# Patient Record
Sex: Female | Born: 2000 | Race: Black or African American | Hispanic: No | Marital: Single | Smoking: Never smoker
Health system: Southern US, Community
[De-identification: ages and names within clinical notes are randomized; demographics above are authoritative.]

## PROBLEM LIST (undated history)

## (undated) HISTORY — PX: DENTAL SURGERY: SHX609

---
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
## 2020-05-24 ENCOUNTER — Encounter (HOSPITAL_COMMUNITY): Payer: Self-pay | Admitting: Emergency Medicine

## 2020-05-24 ENCOUNTER — Other Ambulatory Visit: Payer: Self-pay

## 2020-05-24 ENCOUNTER — Emergency Department (HOSPITAL_COMMUNITY)
Admission: EM | Admit: 2020-05-24 | Discharge: 2020-05-24 | Disposition: A | Discharge status: Home / Self Care | Payer: No Typology Code available for payment source | Attending: Emergency Medicine | Admitting: Emergency Medicine

## 2020-05-24 DIAGNOSIS — M542 Cervicalgia: Secondary | ICD-10-CM | POA: Insufficient documentation

## 2020-05-24 DIAGNOSIS — S0990XA Unspecified injury of head, initial encounter: Secondary | ICD-10-CM | POA: Insufficient documentation
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
## 2020-05-24 NOTE — Discharge Instructions (Signed)
Follow instructions regarding head injury precautions. Follow-up with your primary care provider. Return to the ER if you start to experience worsening headache, additional injuries, blurry vision, numbness in arms or legs or trouble walking.
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
## 2020-07-15 ENCOUNTER — Emergency Department (HOSPITAL_COMMUNITY)
Admission: EM | Admit: 2020-07-15 | Discharge: 2020-07-15 | Disposition: A | Discharge status: Home / Self Care | Payer: Medicaid Other | Attending: Emergency Medicine | Admitting: Emergency Medicine

## 2020-07-15 ENCOUNTER — Emergency Department (HOSPITAL_COMMUNITY): Payer: Medicaid Other

## 2020-07-15 ENCOUNTER — Encounter (HOSPITAL_COMMUNITY): Payer: Self-pay

## 2020-07-15 ENCOUNTER — Other Ambulatory Visit: Payer: Self-pay

## 2020-07-15 DIAGNOSIS — R110 Nausea: Secondary | ICD-10-CM | POA: Insufficient documentation

## 2020-07-15 DIAGNOSIS — U071 COVID-19: Secondary | ICD-10-CM | POA: Diagnosis not present

## 2020-07-15 DIAGNOSIS — R091 Pleurisy: Secondary | ICD-10-CM | POA: Insufficient documentation

## 2020-07-15 DIAGNOSIS — R059 Cough, unspecified: Secondary | ICD-10-CM | POA: Diagnosis present

## 2020-07-15 LAB — URINALYSIS, ROUTINE W REFLEX MICROSCOPIC
Bilirubin Urine: NEGATIVE
Glucose, UA: NEGATIVE mg/dL
Hgb urine dipstick: NEGATIVE
Ketones, ur: 20 mg/dL — AB
Leukocytes,Ua: NEGATIVE
Nitrite: NEGATIVE
Protein, ur: NEGATIVE mg/dL
Specific Gravity, Urine: 1.009 (ref 1.005–1.030)
pH: 6 (ref 5.0–8.0)

## 2020-07-15 LAB — COMPREHENSIVE METABOLIC PANEL
ALT: 12 U/L (ref 0–44)
AST: 19 U/L (ref 15–41)
Albumin: 4 g/dL (ref 3.5–5.0)
Alkaline Phosphatase: 52 U/L (ref 38–126)
Anion gap: 10 (ref 5–15)
BUN: 9 mg/dL (ref 6–20)
CO2: 23 mmol/L (ref 22–32)
Calcium: 9.1 mg/dL (ref 8.9–10.3)
Chloride: 106 mmol/L (ref 98–111)
Creatinine, Ser: 0.93 mg/dL (ref 0.44–1.00)
GFR, Estimated: >60 mL/min (ref >60)
Glucose, Bld: 121 mg/dL — ABNORMAL HIGH (ref 70–99)
Potassium: 3.4 mmol/L — ABNORMAL LOW (ref 3.5–5.1)
Sodium: 139 mmol/L (ref 135–145)
Total Bilirubin: 0.3 mg/dL (ref 0.3–1.2)
Total Protein: 7.5 g/dL (ref 6.5–8.1)

## 2020-07-15 LAB — CBC WITH DIFFERENTIAL/PLATELET
Abs Immature Granulocytes: 0.02 10*3/uL (ref 0.00–0.07)
Basophils Absolute: 0 10*3/uL (ref 0.0–0.1)
Basophils Relative: 1 %
Eosinophils Absolute: 0 10*3/uL (ref 0.0–0.5)
Eosinophils Relative: 0 %
HCT: 35.9 % — ABNORMAL LOW (ref 36.0–46.0)
Hemoglobin: 11.8 g/dL — ABNORMAL LOW (ref 12.0–15.0)
Immature Granulocytes: 1 %
Lymphocytes Relative: 25 %
Lymphs Abs: 0.9 10*3/uL (ref 0.7–4.0)
MCH: 22.8 pg — ABNORMAL LOW (ref 26.0–34.0)
MCHC: 32.9 g/dL (ref 30.0–36.0)
MCV: 69.4 fL — ABNORMAL LOW (ref 80.0–100.0)
Monocytes Absolute: 0.9 10*3/uL (ref 0.1–1.0)
Monocytes Relative: 23 %
Neutro Abs: 2 10*3/uL (ref 1.7–7.7)
Neutrophils Relative %: 50 %
Platelets: 201 10*3/uL (ref 150–400)
RBC: 5.17 MIL/uL — ABNORMAL HIGH (ref 3.87–5.11)
RDW: 18.1 % — ABNORMAL HIGH (ref 11.5–15.5)
WBC: 3.8 10*3/uL — ABNORMAL LOW (ref 4.0–10.5)
nRBC: 0 % (ref 0.0–0.2)

## 2020-07-15 LAB — MONONUCLEOSIS SCREEN: Mono Screen: NEGATIVE

## 2020-07-15 LAB — LIPASE, BLOOD: Lipase: 30 U/L (ref 11–51)

## 2020-07-15 LAB — GROUP A STREP BY PCR: Group A Strep by PCR: NOT DETECTED

## 2020-07-15 LAB — I-STAT BETA HCG BLOOD, ED (MC, WL, AP ONLY): I-stat hCG, quantitative: <5 m[IU]/mL (ref <5)

## 2020-07-15 LAB — D-DIMER, QUANTITATIVE: D-Dimer, Quant: 0.68 ug/mL-FEU — ABNORMAL HIGH (ref 0.00–0.50)

## 2020-07-15 MED ORDER — ACETAMINOPHEN 500 MG PO TABS: 500.0000 mg | ORAL_TABLET | Freq: Four times a day (QID) | ORAL | 0 refills | Status: AC | PRN

## 2020-07-15 MED ORDER — IOHEXOL 350 MG/ML SOLN
100.0000 mL | Freq: Once | INTRAVENOUS | Status: AC | PRN
  Administered 2020-07-15: 58 mL via INTRAVENOUS

## 2020-07-15 MED ORDER — IBUPROFEN 600 MG PO TABS: 600.0000 mg | ORAL_TABLET | Freq: Four times a day (QID) | ORAL | 0 refills | Status: AC | PRN

## 2020-07-15 MED ORDER — LACTATED RINGERS IV BOLUS
1000.0000 mL | Freq: Once | INTRAVENOUS | Status: AC
  Administered 2020-07-15: 1000 mL via INTRAVENOUS

## 2020-07-15 MED ORDER — ALBUTEROL SULFATE HFA 108 (90 BASE) MCG/ACT IN AERS: 2.0000 | INHALATION_SPRAY | RESPIRATORY_TRACT | 0 refills | Status: DC | PRN

## 2020-07-15 MED ORDER — KETOROLAC TROMETHAMINE 30 MG/ML IJ SOLN
30.0000 mg | Freq: Once | INTRAMUSCULAR | Status: AC
  Administered 2020-07-15: 30 mg via INTRAVENOUS
  Filled 2020-07-15: qty 1

## 2020-07-15 NOTE — ED Triage Notes (Addendum)
Coming from home, covid symptoms for 2 weeks, denies asthma, productive cough with yellow sputum, subjective fever

## 2020-07-15 NOTE — ED Notes (Signed)
Patient ambulated from triage to room 22, stable gait, no obvious discomfort

## 2020-07-15 NOTE — ED Notes (Addendum)
Patient transported to CT 

## 2020-07-15 NOTE — Discharge Instructions (Addendum)
1.  Your CT scan does not show any evidence of blood clot or significant pneumonia.  After having had a viral illness, people can get a condition called pleurisy.  This is described in your discharge instructions. 2.  Take ibuprofen 600 mg every 8 hours with food for pain control.  May also take acetaminophen as well if you need additional pain control.  You have been given a prescription for an inhaler to use if you feel short of breath or like you are wheezing. 3.  You need a recheck with your family doctor within 3 to 7 days.  If your symptoms are worsening, you are developing new concerning symptoms you must return to the emergency department for recheck

## 2020-07-15 NOTE — ED Notes (Signed)
Patient back from CT at this time

## 2020-07-15 NOTE — ED Notes (Signed)
Patient provided with warm blankets per request 

## 2020-07-15 NOTE — ED Provider Notes (Signed)
At this [facility]  [facility]OS[facility]-EMERGENCY DEPT Provider Note   CSN: [id number] Arrival date & time: 07/15/20  1062     History Chief Complaint  Patient presents with  . covid symptoms    Richardine Peppers is a 20 y.o. female.  HPI Patient reports that she has no previous medical problems.  She tested positive for COVID [id number].  She reports 2 weeks ago at onset she had body aches and chills and felt very bad.  She had some cough and nausea and fatigue.  She reports after the initial week she was feeling better and symptoms seem to be resolving.  She reports now about 2 days ago she started to feel really bad like she did at the beginning of the illness.  She reports she is felt achy and fatigued.  She reports now she also is having chest pain which she had not previously had.  Chest pain is central and in her left upper back.  She reports it is worse with a deep breath cough or movement.  She reports she has had nausea but no vomiting.  She has been taking fluids.  Reports she also has a sore throat.  She has some lower abdominal pain.  No diarrhea.  No pain or burning with urination.  No swelling or pain in the legs.  No personal history of DVT.  Patient is not on birth control.  She reports she smokes vapes and marijuana but not cigarettes.    History reviewed. No pertinent past medical history.  There are no problems to display for this patient.   Past Surgical History:  Procedure Laterality Date  . DENTAL SURGERY       OB History   No obstetric history on file.     Family History  Problem Relation Age of Onset  . Healthy Mother   . Healthy Father     Social History   Tobacco Use  . Smoking status: Never Smoker  . Smokeless tobacco: Never Used  Substance Use Topics  . Alcohol use: Not Currently  . Drug use: Not Currently    Home Medications Prior to Admission medications   Medication Sig Start Date End Date Taking? Authorizing Provider  acetaminophen  (TYLENOL) 500 MG tablet Take 1 tablet (500 mg total) by mouth every 6 (six) hours as needed. 07/15/20  Yes Arby Barrette, MD  albuterol (VENTOLIN HFA) 108 (90 Base) MCG/ACT inhaler Inhale 2 puffs into the lungs every 4 (four) hours as needed for wheezing or shortness of breath. 07/15/20  Yes Phallon Haydu, Lebron Conners, MD  ibuprofen (ADVIL) 600 MG tablet Take 1 tablet (600 mg total) by mouth every 6 (six) hours as needed. 07/15/20  Yes Arby Barrette, MD  metroNIDAZOLE (FLAGYL) 500 MG tablet Take 1 tablet (500 mg total) by mouth 2 (two) times daily. 05/16/18   Elvina Sidle, MD    Allergies    Patient has no known allergies.  Review of Systems   Review of Systems 10 systems reviewed negative except HPI Physical Exam Updated Vital Signs BP 127/79   Pulse 76   Temp 98.4 F (36.9 C) (Oral)   Resp 16   Ht 5' (1.524 m)   Wt 54.4 kg   LMP 06/30/2020 Comment: negative HCG blood test 07-15-20  SpO2 100%   BMI 23.44 kg/m   Physical Exam Constitutional:      Appearance: She is well-developed.  HENT:     Head: Normocephalic and atraumatic.     Mouth/Throat:  Comments: Posterior oropharynx is widely patent.  Mild erythema of the tonsillar pillars.  No exudates or significant swelling. Eyes:     Extraocular Movements: Extraocular movements intact.     Conjunctiva/sclera: Conjunctivae normal.  Cardiovascular:     Rate and Rhythm: Normal rate and regular rhythm.     Heart sounds: Normal heart sounds.  Pulmonary:     Effort: Pulmonary effort is normal.     Breath sounds: Normal breath sounds.  Abdominal:     General: Bowel sounds are normal. There is no distension.     Palpations: Abdomen is soft.     Tenderness: There is abdominal tenderness.     Comments: Mild to moderate tenderness diffusely of the lower abdomen.  No guarding.  Upper abdomen nontender no appreciable hepatosplenomegaly.  Musculoskeletal:        General: No swelling or tenderness. Normal range of motion.     Cervical back:  Neck supple.     Right lower leg: No edema.     Left lower leg: No edema.  Skin:    General: Skin is warm and dry.  Neurological:     Mental Status: She is alert and oriented to person, place, and time.     GCS: GCS eye subscore is 4. GCS verbal subscore is 5. GCS motor subscore is 6.     Coordination: Coordination normal.  Psychiatric:        Mood and Affect: Mood normal.     ED Results / Procedures / Treatments   Labs (all labs ordered are listed, but only abnormal results are displayed) Labs Reviewed  COMPREHENSIVE METABOLIC PANEL - Abnormal; Notable for the following components:      Result Value   Potassium 3.4 (*)    Glucose, Bld 121 (*)    All other components within normal limits  CBC WITH DIFFERENTIAL/PLATELET - Abnormal; Notable for the following components:   WBC 3.8 (*)    RBC 5.17 (*)    Hemoglobin 11.8 (*)    HCT 35.9 (*)    MCV 69.4 (*)    MCH 22.8 (*)    RDW 18.1 (*)    All other components within normal limits  D-DIMER, QUANTITATIVE - Abnormal; Notable for the following components:   D-Dimer, Quant 0.68 (*)    All other components within normal limits  URINALYSIS, ROUTINE W REFLEX MICROSCOPIC - Abnormal; Notable for the following components:   APPearance CLOUDY (*)    Ketones, ur 20 (*)    All other components within normal limits  GROUP A STREP BY PCR  LIPASE, BLOOD  MONONUCLEOSIS SCREEN  I-STAT BETA HCG BLOOD, ED ([facility], [facility], [facility] ONLY)    EKG None  Radiology CT Angio Chest PE W/Cm &/Or Wo Cm  Result Date: 07/15/2020 CLINICAL DATA:  Positive D-dimer, cough and shortness of breath with COVID few weeks prior EXAM: CT ANGIOGRAPHY CHEST WITH CONTRAST TECHNIQUE: Multidetector CT imaging of the chest was performed using the standard protocol during bolus administration of intravenous contrast. Multiplanar CT image reconstructions and MIPs were obtained to evaluate the vascular anatomy. CONTRAST:  71mL OMNIPAQUE IOHEXOL 350 MG/ML SOLN COMPARISON:  None.  FINDINGS: Cardiovascular: Satisfactory opacification of the pulmonary arteries to the segmental level. No evidence of pulmonary embolism. Normal heart size. No pericardial effusion. Mediastinum/Nodes: No enlarged lymph nodes. Thyroid and esophagus are unremarkable. Lungs/Pleura: Minimal small foci of ground-glass. No pleural effusion. No pneumothorax. Upper Abdomen: No acute abnormality. Musculoskeletal: No significant or acute osseous abnormality. Review of the  MIP images confirms the above findings. IMPRESSION: No evidence of acute pulmonary embolism. Minimal small foci of ground-glass density may reflect atelectasis or minor residual changes of recent pneumonia. Electronically Signed   By: Guadlupe Spanish M.D.   On: 07/15/2020 12:41   DG Chest Port 1 View  Result Date: 07/15/2020 CLINICAL DATA:  COVID-19 symptoms. Productive cough and subjective fever. EXAM: PORTABLE CHEST 1 VIEW COMPARISON:  02/16/2018 FINDINGS: The cardiomediastinal silhouette is within normal limits. The lungs are well inflated and clear. There is no evidence of pleural effusion or pneumothorax. No acute osseous abnormality is identified. IMPRESSION: No active disease. Electronically Signed   By: Sebastian Ache M.D.   On: 07/15/2020 10:26    Procedures Procedures   Medications Ordered in ED Medications  lactated ringers bolus 1,000 mL (0 mLs Intravenous Stopped 07/15/20 1139)  ketorolac (TORADOL) 30 MG/ML injection 30 mg (30 mg Intravenous Given 07/15/20 1033)  iohexol (OMNIPAQUE) 350 MG/ML injection 100 mL (58 mLs Intravenous Contrast Given 07/15/20 1206)    ED Course  I have reviewed the triage vital signs and the nursing notes.  Pertinent labs & imaging results that were available during my care of the patient were reviewed by me and considered in my medical decision making (see chart for details).    MDM Rules/Calculators/A&P                         Patient presents as outlined.  Time, PE study is negative for PE.  Minor  groundglass findings probably residual from COVID.  No signs of consolidation or focal pneumonia.  Clinically patient is well in appearance.  She has no respiratory distress.  At this time chest pain most likely pleurisy post COVID.  She is stable.  Appropriate at this time to continue symptomatic treatment with ibuprofen and Tylenol as needed.  Albuterol inhaler prescribed if shortness of breath or wheezing.  Return precautions reviewed. Final Clinical Impression(s) / ED Diagnoses Final diagnoses:  Pleurisy  COVID-19    Rx / DC Orders ED Discharge Orders         Ordered    ibuprofen (ADVIL) 600 MG tablet  Every 6 hours PRN        07/15/20 1410    acetaminophen (TYLENOL) 500 MG tablet  Every 6 hours PRN        07/15/20 1410    albuterol (VENTOLIN HFA) 108 (90 Base) MCG/ACT inhaler  Every 4 hours PRN        07/15/20 1410           Arby Barrette, MD 07/15/20 1413

## 2021-06-23 IMAGING — CT CT ANGIO CHEST
3 of 7 series · 18 of 36 positions shown · IV contrast (OMNIPAQUE 350)
Comparison: None.

CLINICAL DATA: Positive D-dimer, cough and shortness of breath with
COVID few weeks prior

EXAM:
CT ANGIOGRAPHY CHEST WITH CONTRAST
TECHNIQUE: Multidetector CT imaging of the chest was performed using the
standard protocol during bolus administration of intravenous
contrast. Multiplanar CT image reconstructions and MIPs were
obtained to evaluate the vascular anatomy.
CONTRAST:  58mL OMNIPAQUE IOHEXOL 350 MG/ML SOLN

[Series 5: thins · axial · 0.58mm/px · z∈[-160,+55]mm · 12 of 255 slices shown]
[im 20/255  lung]
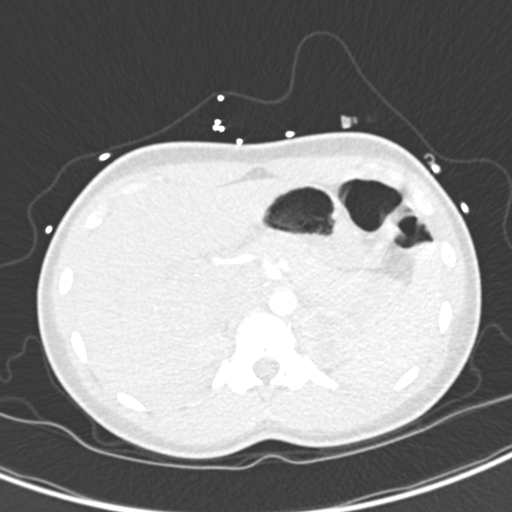
[im 40/255  mediastinal]
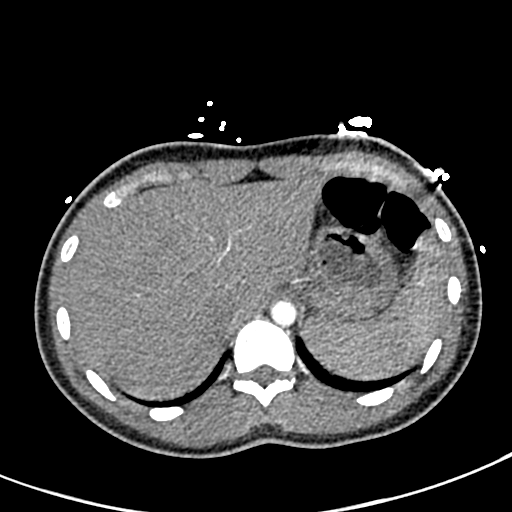
[im 59/255  lung]
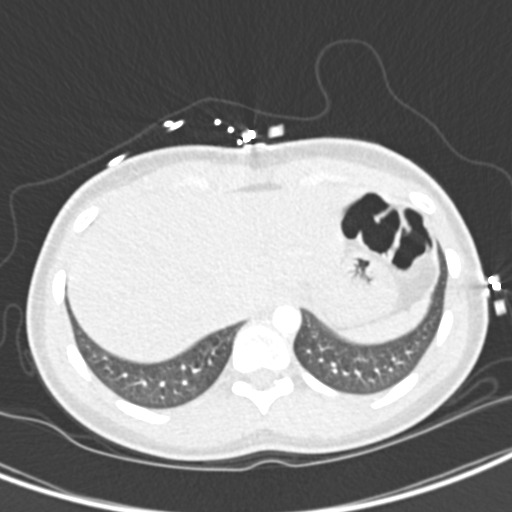
[im 79/255  mediastinal]
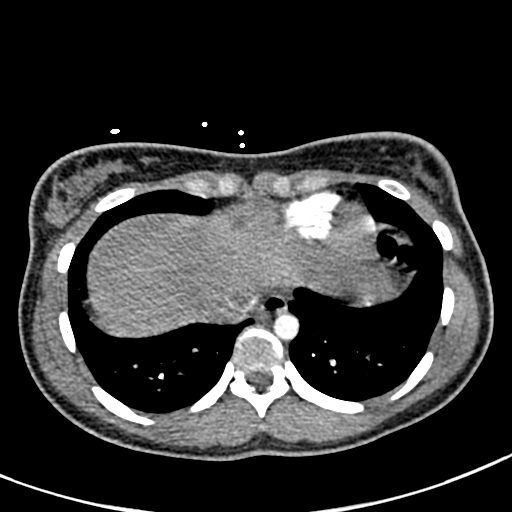
[im 98/255  lung]
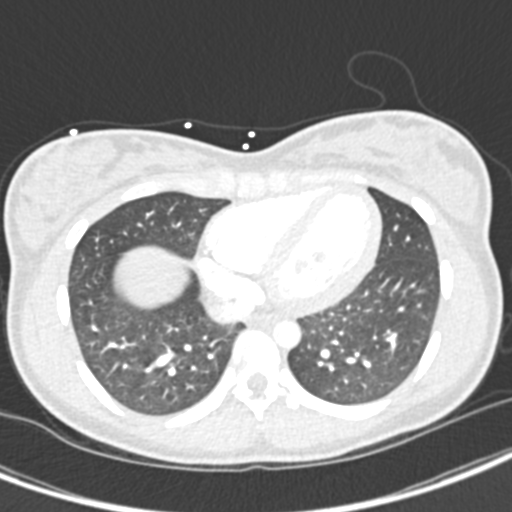
[im 118/255  mediastinal]
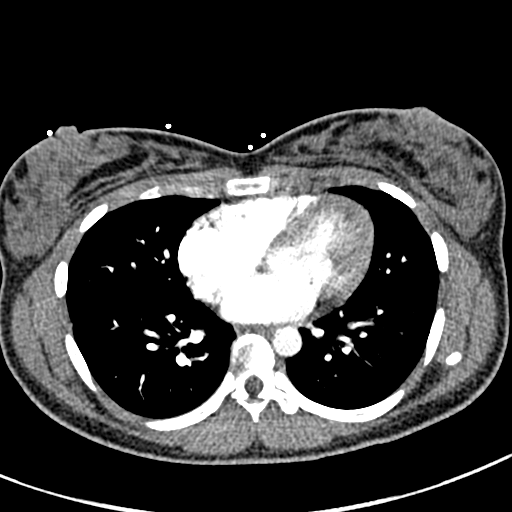
[im 137/255  lung]
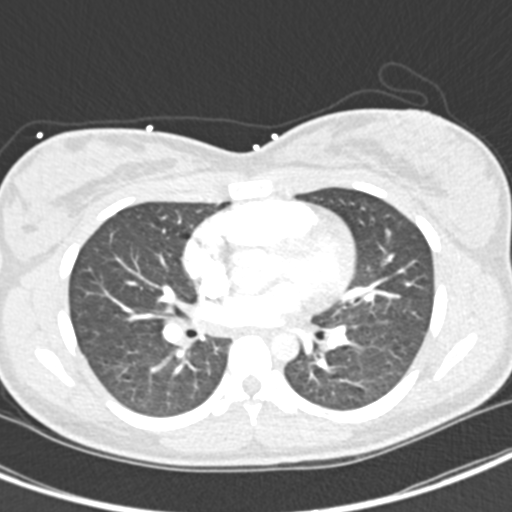
[im 157/255  mediastinal]
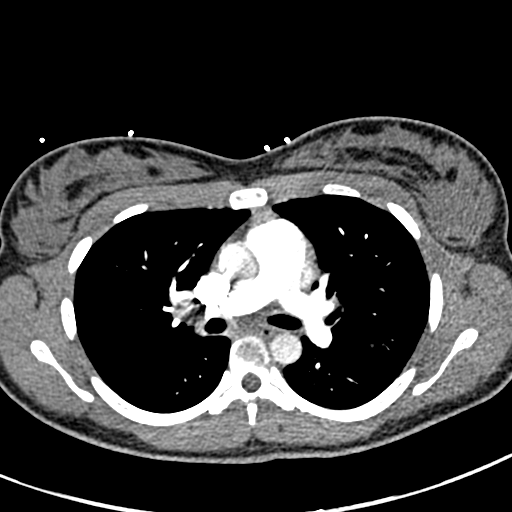
[im 176/255  lung]
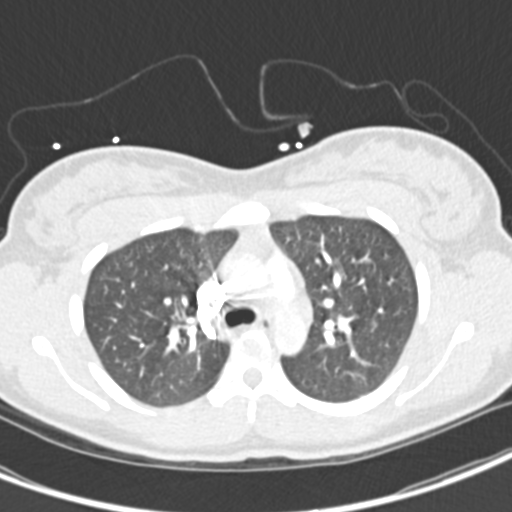
[im 196/255  mediastinal]
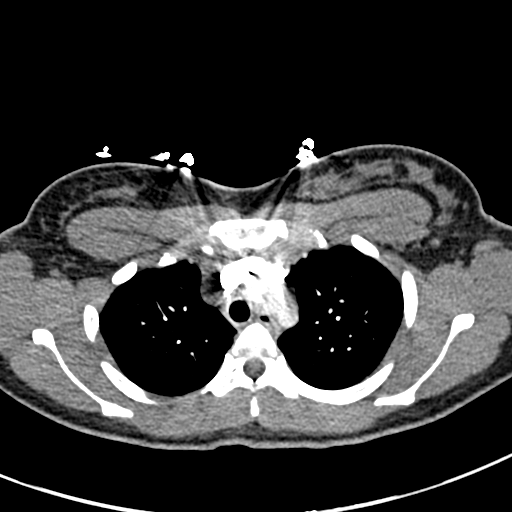
[im 215/255  lung]
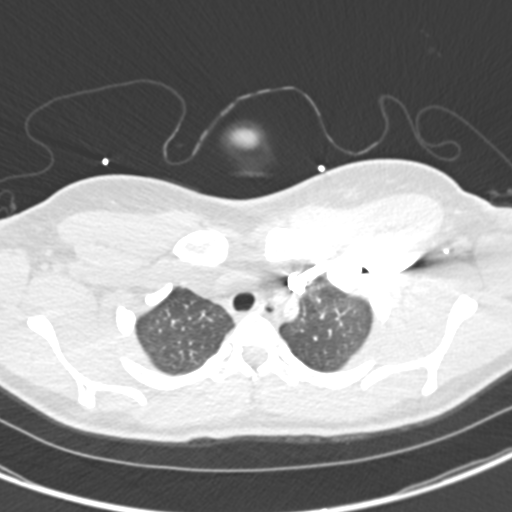
[im 235/255  mediastinal]
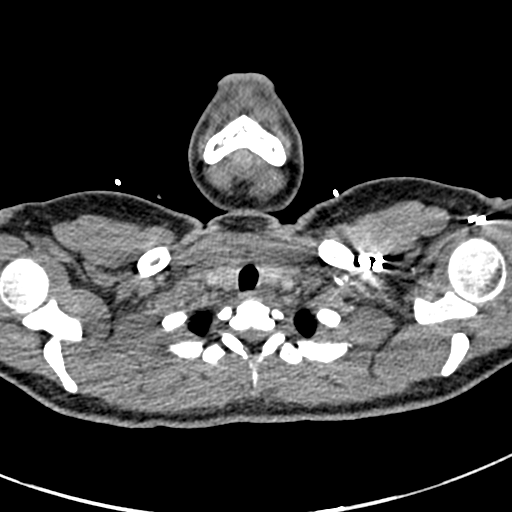

[Series 6: coronal mpr · coronal · 0.58mm/px · 1 of 92 slices shown]
[im 46/92  mediastinal]
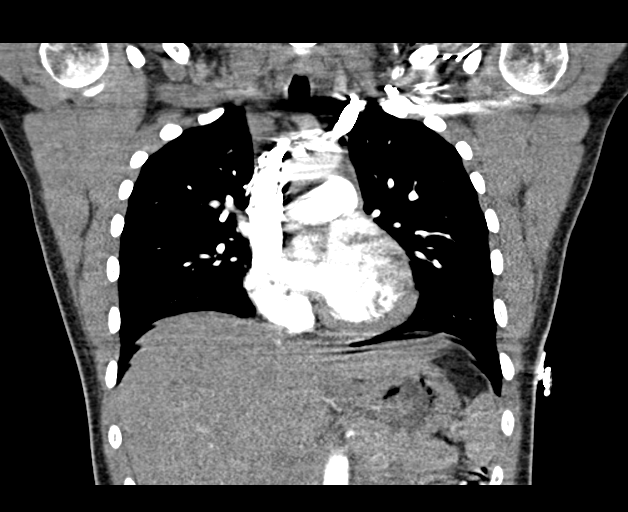

[Series 10: lung · axial · 0.58mm/px · z∈[-128,+32]mm · 5 of 122 slices shown]
[im 21/122  mediastinal]
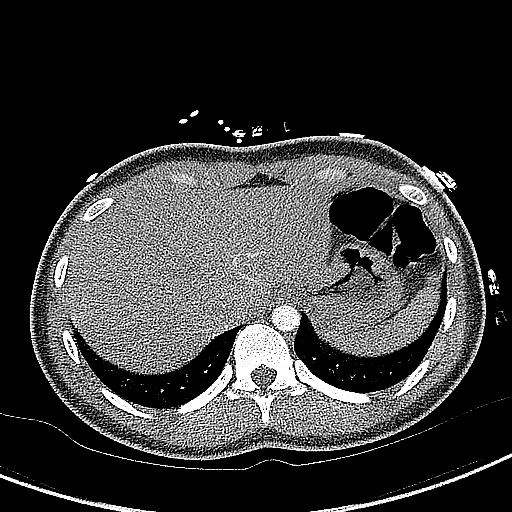
[im 41/122  mediastinal]
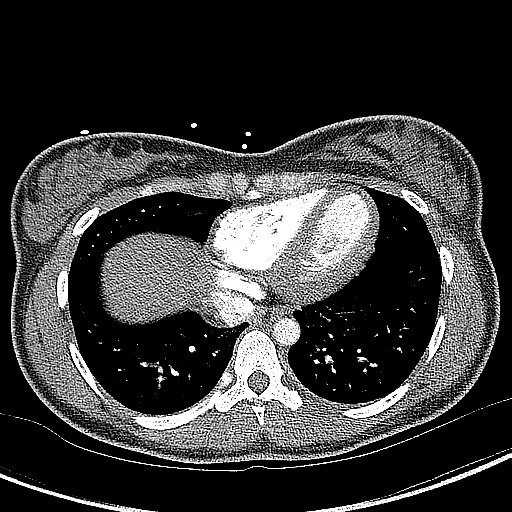
[im 61/122  mediastinal]
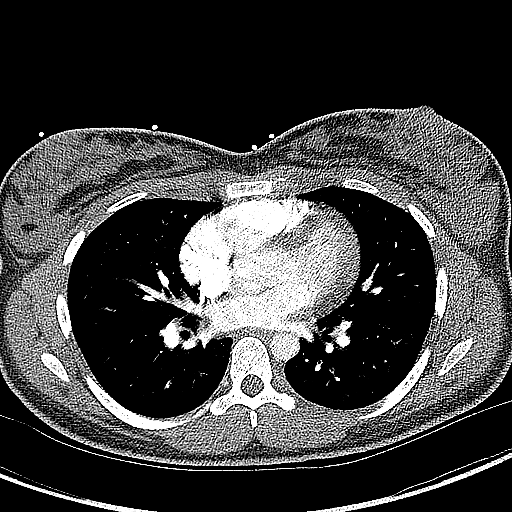
[im 81/122  mediastinal]
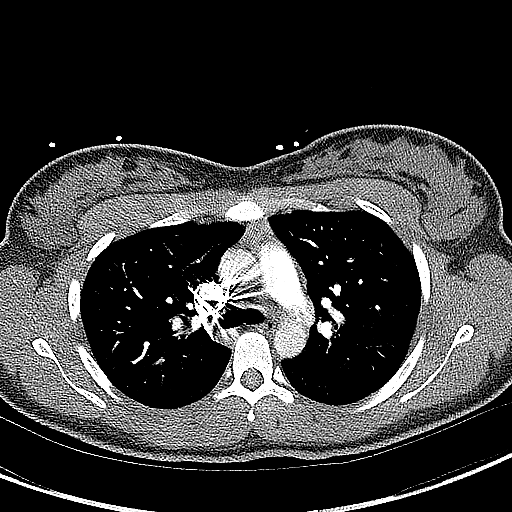
[im 101/122  mediastinal]
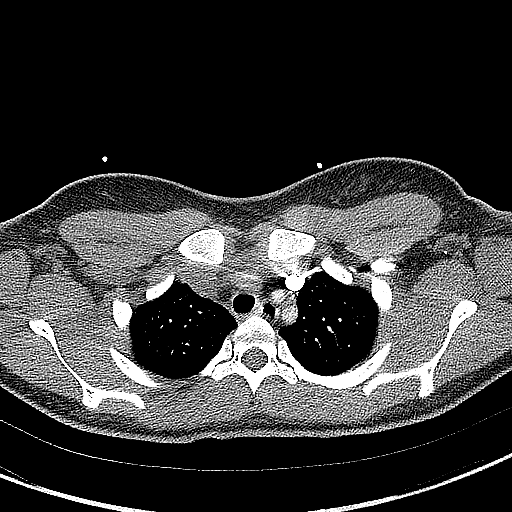

[18 of 36 positions shown; findings below may reference images not displayed]

FINDINGS: Cardiovascular: Satisfactory opacification of the pulmonary arteries
to the segmental level. No evidence of pulmonary embolism. Normal
heart size. No pericardial effusion.

Mediastinum/Nodes: No enlarged lymph nodes. Thyroid and esophagus
are unremarkable.

Lungs/Pleura: Minimal small foci of ground-glass. No pleural
effusion. No pneumothorax.

Upper Abdomen: No acute abnormality.

Musculoskeletal: No significant or acute osseous abnormality.

Review of the MIP images confirms the above findings.
IMPRESSION: No evidence of acute pulmonary embolism.

Minimal small foci of ground-glass density may reflect atelectasis
or minor residual changes of recent pneumonia.

## 2022-06-29 ENCOUNTER — Ambulatory Visit
Admission: EM | Admit: 2022-06-29 | Discharge: 2022-06-29 | Disposition: A | Discharge status: Home / Self Care | Payer: Medicaid Other | Attending: Urgent Care | Admitting: Urgent Care

## 2022-06-29 DIAGNOSIS — F17290 Nicotine dependence, other tobacco product, uncomplicated: Secondary | ICD-10-CM | POA: Insufficient documentation

## 2022-06-29 DIAGNOSIS — Z720 Tobacco use: Secondary | ICD-10-CM

## 2022-06-29 DIAGNOSIS — Z1152 Encounter for screening for COVID-19: Secondary | ICD-10-CM | POA: Diagnosis not present

## 2022-06-29 DIAGNOSIS — Z7251 High risk heterosexual behavior: Secondary | ICD-10-CM | POA: Insufficient documentation

## 2022-06-29 DIAGNOSIS — B349 Viral infection, unspecified: Secondary | ICD-10-CM | POA: Insufficient documentation

## 2022-06-29 MED ORDER — PROMETHAZINE-DM 6.25-15 MG/5ML PO SYRP: 5.0000 mL | ORAL_SOLUTION | Freq: Three times a day (TID) | ORAL | 0 refills | Status: AC | PRN

## 2022-06-29 MED ORDER — CETIRIZINE HCL 10 MG PO TABS: 10.0000 mg | ORAL_TABLET | Freq: Every day | ORAL | 0 refills | Status: DC

## 2022-06-29 MED ORDER — PREDNISONE 20 MG PO TABS: ORAL_TABLET | ORAL | 0 refills | Status: DC

## 2022-06-29 NOTE — Discharge Instructions (Signed)
We will notify you of your test results as they arrive and may take between about 24 hours.  I encourage you to sign up for MyChart if you have not already done so as this can be the easiest way for us to communicate results to you online or through a phone app.  Generally, we only contact you if it is a positive test result.  In the meantime, if you develop worsening symptoms including fever, chest pain, shortness of breath despite our current treatment plan then please report to the emergency room as this may be a sign of worsening status from possible viral infection.  Otherwise, we will manage this as a viral syndrome. For sore throat or cough try using a honey-based tea. Use 3 teaspoons of honey with juice squeezed from half lemon. Place shaved pieces of ginger into 1/2-1 cup of water and warm over stove top. Then mix the ingredients and repeat every 4 hours as needed. Please take Tylenol 500mg-650mg every 6 hours for aches and pains, fevers. Hydrate very well with at least 2 liters of water. Eat light meals such as soups to replenish electrolytes and soft fruits, veggies. Start an antihistamine like Zyrtec (10mg daily) for postnasal drainage, sinus congestion.  You can take this together with prednisone.  Use the cough medications as needed.   

## 2022-06-29 NOTE — ED Provider Notes (Signed)
[facility]  Note:  This document was prepared using Conservation officer, historic buildings and may include unintentional dictation errors.  MRN: [id number] DOB: 09-13-2000  Subjective:   Erin Mccarthy is a 22 y.o. female presenting for 4 day history of sinus/chest congestion, sinus pain, throat pain, painful swallowing, productive cough that can elicit chest pain, mild shob. Denies fever, wheezing, body aches. No history of asthma. Smokes marijuana multiple times weekly, vapes daily.  Has been sick frequently this year already.   No current facility-administered medications for this encounter.  Current Outpatient Medications:    acetaminophen (TYLENOL) 500 MG tablet, Take 1 tablet (500 mg total) by mouth every 6 (six) hours as needed., Disp: 30 tablet, Rfl: 0   albuterol (VENTOLIN HFA) 108 (90 Base) MCG/ACT inhaler, Inhale 2 puffs into the lungs every 4 (four) hours as needed for wheezing or shortness of breath., Disp: 6.7 g, Rfl: 0   ibuprofen (ADVIL) 600 MG tablet, Take 1 tablet (600 mg total) by mouth every 6 (six) hours as needed., Disp: 30 tablet, Rfl: 0   metroNIDAZOLE (FLAGYL) 500 MG tablet, Take 1 tablet (500 mg total) by mouth 2 (two) times daily., Disp: 14 tablet, Rfl: 0   No Known Allergies  History reviewed. No pertinent past medical history.   Past Surgical History:  Procedure Laterality Date   DENTAL SURGERY      Family History  Problem Relation Age of Onset   Healthy Mother    Healthy Father     Social History   Tobacco Use   Smoking status: Never   Smokeless tobacco: Never  Vaping Use   Vaping Use: Every day  Substance Use Topics   Alcohol use: Yes    Comment: weekly   Drug use: Yes    Types: Marijuana    ROS   Objective:   Vitals: BP 115/78 (BP Location: Right Arm)   Pulse 99   Temp 99.4 F (37.4 C) (Oral)   Resp 20   LMP 06/15/2022 (Approximate)   SpO2 97%   Physical Exam Constitutional:      General: She is not in  acute distress.    Appearance: Normal appearance. She is well-developed and normal weight. She is not ill-appearing, toxic-appearing or diaphoretic.  HENT:     Head: Normocephalic and atraumatic.     Right Ear: Tympanic membrane, ear canal and external ear normal. No drainage or tenderness. No middle ear effusion. There is no impacted cerumen. Tympanic membrane is not erythematous or bulging.     Left Ear: Tympanic membrane, ear canal and external ear normal. No drainage or tenderness.  No middle ear effusion. There is no impacted cerumen. Tympanic membrane is not erythematous or bulging.     Nose: Nose normal. No congestion or rhinorrhea.     Mouth/Throat:     Mouth: Mucous membranes are moist. No oral lesions.     Pharynx: No pharyngeal swelling, oropharyngeal exudate, posterior oropharyngeal erythema or uvula swelling.     Tonsils: No tonsillar exudate or tonsillar abscesses.  Eyes:     General: No scleral icterus.       Right eye: No discharge.        Left eye: No discharge.     Extraocular Movements: Extraocular movements intact.     Right eye: Normal extraocular motion.     Left eye: Normal extraocular motion.     Conjunctiva/sclera: Conjunctivae normal.  Cardiovascular:     Rate and Rhythm: Normal rate and  regular rhythm.     Heart sounds: Normal heart sounds. No murmur heard.    No friction rub. No gallop.  Pulmonary:     Effort: Pulmonary effort is normal. No respiratory distress.     Breath sounds: No stridor. No wheezing, rhonchi or rales.  Chest:     Chest wall: No tenderness.  Musculoskeletal:     Cervical back: Normal range of motion and neck supple.  Lymphadenopathy:     Cervical: No cervical adenopathy.  Skin:    General: Skin is warm and dry.  Neurological:     General: No focal deficit present.     Mental Status: She is alert and oriented to person, place, and time.  Psychiatric:        Mood and Affect: Mood normal.        Behavior: Behavior normal.      Assessment and Plan :   PDMP not reviewed this encounter.  1. Acute viral syndrome   2. Vapes nicotine containing substance   3. Unprotected sex     Deferred imaging given clear cardiopulmonary exam, hemodynamically stable vital signs. Patient requested HIV testing, general screening for having frequent illness.  I was agreeable to getting an HIV and the CBC with differential.  Recommended establishing care with a primary care practice for other general health screenings.  For her current respiratory symptoms in the context of her vaping and marijuana use recommended oral prednisone course and albuterol. Will manage for viral illness such as viral URI, viral syndrome, viral rhinitis, COVID-19. Recommended supportive care. Offered scripts for symptomatic relief. Testing is pending. Counseled patient on potential for adverse effects with medications prescribed/recommended today, ER and return-to-clinic precautions discussed, patient verbalized understanding.   Patient should definitely undergo Paxlovid treatment should she test positive.   Wallis Bamberg, [place] 06/29/22 1525

## 2022-06-29 NOTE — ED Triage Notes (Signed)
Pt c/o cough, head/chest congestion x 4 days-no relief with OTC meds-NAD-steady gait

## 2022-06-30 LAB — SARS CORONAVIRUS 2 (TAT 6-24 HRS): SARS Coronavirus 2: NEGATIVE

## 2022-06-30 LAB — HIV ANTIBODY (ROUTINE TESTING W REFLEX): HIV Screen 4th Generation wRfx: NONREACTIVE

## 2022-07-01 LAB — CBC WITH DIFFERENTIAL/PLATELET
Basophils Absolute: 0.1 10*3/uL (ref 0.0–0.2)
Basos: 1 %
EOS (ABSOLUTE): 0.1 10*3/uL (ref 0.0–0.4)
Eos: 2 %
Hematocrit: 38.9 % (ref 34.0–46.6)
Hemoglobin: 12.2 g/dL (ref 11.1–15.9)
Immature Grans (Abs): 0 10*3/uL (ref 0.0–0.1)
Immature Granulocytes: 0 %
Lymphocytes Absolute: 1.2 10*3/uL (ref 0.7–3.1)
Lymphs: 28 %
MCH: 23.5 pg — ABNORMAL LOW (ref 26.6–33.0)
MCHC: 31.4 g/dL — ABNORMAL LOW (ref 31.5–35.7)
MCV: 75 fL — ABNORMAL LOW (ref 79–97)
Monocytes Absolute: 0.7 10*3/uL (ref 0.1–0.9)
Monocytes: 17 %
Neutrophils Absolute: 2.3 10*3/uL (ref 1.4–7.0)
Neutrophils: 52 %
Platelets: 242 10*3/uL (ref 150–450)
RBC: 5.2 x10E6/uL (ref 3.77–5.28)
RDW: 17.1 % — ABNORMAL HIGH (ref 11.7–15.4)
WBC: 4.4 10*3/uL (ref 3.4–10.8)

## 2022-07-01 LAB — SPECIMEN STATUS REPORT

## 2023-02-08 ENCOUNTER — Other Ambulatory Visit: Payer: Self-pay

## 2023-02-08 ENCOUNTER — Encounter (HOSPITAL_BASED_OUTPATIENT_CLINIC_OR_DEPARTMENT_OTHER): Payer: Self-pay | Admitting: Urology

## 2023-02-08 ENCOUNTER — Emergency Department (HOSPITAL_BASED_OUTPATIENT_CLINIC_OR_DEPARTMENT_OTHER): Payer: Medicaid Other

## 2023-02-08 ENCOUNTER — Emergency Department (HOSPITAL_BASED_OUTPATIENT_CLINIC_OR_DEPARTMENT_OTHER)
Admission: EM | Admit: 2023-02-08 | Discharge: 2023-02-08 | Disposition: A | Discharge status: Home / Self Care | Payer: Medicaid Other | Attending: Emergency Medicine | Admitting: Emergency Medicine

## 2023-02-08 DIAGNOSIS — M545 Low back pain, unspecified: Secondary | ICD-10-CM | POA: Diagnosis not present

## 2023-02-08 DIAGNOSIS — M25562 Pain in left knee: Secondary | ICD-10-CM | POA: Diagnosis present

## 2023-02-08 DIAGNOSIS — G8929 Other chronic pain: Secondary | ICD-10-CM

## 2023-02-08 MED ORDER — PREDNISONE 10 MG PO TABS: 20.0000 mg | ORAL_TABLET | Freq: Every day | ORAL | 0 refills | Status: AC

## 2023-02-08 MED ORDER — METHOCARBAMOL 500 MG PO TABS: 500.0000 mg | ORAL_TABLET | Freq: Two times a day (BID) | ORAL | 0 refills | Status: AC

## 2023-02-08 MED ORDER — LIDOCAINE 5 % EX PTCH: 1.0000 | MEDICATED_PATCH | CUTANEOUS | 0 refills | Status: AC

## 2023-02-08 NOTE — Discharge Instructions (Signed)
It was a pleasure taking care of you here today  We have written for a few medications to help with your left knee pain and your lower back pain  Take as prescribed  Make sure to follow-up outpatient, return for any worsening symptoms

## 2023-02-08 NOTE — ED Triage Notes (Signed)
Pt ambulatory to triage with normal gait   Pt states pain in left knee on anterior side  States hit it on her wooden bed approx 1 month ago  States swelling and pain with bending  Also states right side sciatic nerve pain for > 1 month

## 2023-02-08 NOTE — ED Provider Notes (Cosign Needed Addendum)
[facility] Provider Note   CSN: [id number] Arrival date & time: 02/08/23  1514    History  Chief Complaint  Patient presents with   Knee Pain   Back Pain    Erin Mccarthy is a 22 y.o. female no significant past medical history here for evaluation of left knee pain and right lower back pain.  Patient states she hit the anterior lateral knee on the wooden bedpost proxy 1 month ago.  Since then she has had pain, intermittent swelling.  No redness or warmth.  States her knee "gives out on her."  Pain is worse when she places pressure on the leg.  Tylenol Motrin as needed for pain.  She is also had intermittent right lower back pain which goes down into her right great toe over the last few months.  Worse with movement.  Goes down to her posterior right glute to the back of her leg.  No weakness.  No fever, history of IVDU, bowel or bladder incontinence, saddle paresthesia, swelling, redness, history of VTE.  No chest pain, shortness of breath. No dysuria, hematuria, denies chance of pregnancy.   HPI     Home Medications Prior to Admission medications   Medication Sig Start Date End Date Taking? Authorizing Provider  lidocaine (LIDODERM) 5 % Place 1 patch onto the skin daily. Remove & Discard patch within 12 hours or as directed by MD 02/08/23  Yes Denney Shein A, PA-C  methocarbamol (ROBAXIN) 500 MG tablet Take 1 tablet (500 mg total) by mouth 2 (two) times daily. 02/08/23  Yes Jordis Repetto A, PA-C  predniSONE (DELTASONE) 10 MG tablet Take 2 tablets (20 mg total) by mouth daily for 5 days. 02/08/23 02/13/23 Yes Braxen Dobek A, PA-C  acetaminophen (TYLENOL) 500 MG tablet Take 1 tablet (500 mg total) by mouth every 6 (six) hours as needed. 07/15/20   Arby Barrette, MD  albuterol (VENTOLIN HFA) 108 (90 Base) MCG/ACT inhaler Inhale 2 puffs into the lungs every 4 (four) hours as needed for wheezing or shortness of breath. 07/15/20    Arby Barrette, MD  cetirizine (ZYRTEC ALLERGY) 10 MG tablet Take 1 tablet (10 mg total) by mouth daily. 06/29/22   Wallis Bamberg, PA-C  ibuprofen (ADVIL) 600 MG tablet Take 1 tablet (600 mg total) by mouth every 6 (six) hours as needed. 07/15/20   Arby Barrette, MD  metroNIDAZOLE (FLAGYL) 500 MG tablet Take 1 tablet (500 mg total) by mouth 2 (two) times daily. 05/16/18   Elvina Sidle, MD  promethazine-dextromethorphan (PROMETHAZINE-DM) 6.25-15 MG/5ML syrup Take 5 mLs by mouth 3 (three) times daily as needed for cough. 06/29/22   Wallis Bamberg, PA-C      Allergies    Patient has no known allergies.    Review of Systems   Review of Systems  Constitutional: Negative.   HENT: Negative.    Respiratory: Negative.    Cardiovascular: Negative.   Gastrointestinal: Negative.   Genitourinary: Negative.   Musculoskeletal:  Positive for back pain (right lower).       Left knee pain  Skin: Negative.   Neurological: Negative.   All other systems reviewed and are negative.   Physical Exam Updated Vital Signs BP 126/86 (BP Location: Left Arm)   Pulse 90   Temp 98.3 F (36.8 C)   Resp 18   Ht 5\' 1"  (1.549 m)   Wt 64.3 kg   LMP 01/19/2023   SpO2 96%   BMI 26.77 kg/m  Physical Exam Vitals and nursing note reviewed.  Constitutional:      General: She is not in acute distress.    Appearance: She is well-developed. She is not ill-appearing, toxic-appearing or diaphoretic.  HENT:     Head: Normocephalic and atraumatic.     Nose: Nose normal.     Mouth/Throat:     Mouth: Mucous membranes are moist.  Eyes:     Pupils: Pupils are equal, round, and reactive to light.  Cardiovascular:     Rate and Rhythm: Normal rate.     Pulses: Normal pulses.          Dorsalis pedis pulses are 2+ on the right side and 2+ on the left side.     Heart sounds: Normal heart sounds.  Pulmonary:     Effort: Pulmonary effort is normal. No respiratory distress.  Abdominal:     General: Bowel sounds are normal.  There is no distension.     Palpations: Abdomen is soft.  Musculoskeletal:        General: Normal range of motion.     Cervical back: Normal range of motion.     Comments: Diffuse tenderness superior lateral aspect right knee.  Suprapatellar effusion.  Negative anterior drawer.  Mild pain at valgus stress on right leg.  Able to straight leg raise without difficulty.  Nontender femur, tib-fib.  Mild tenderness right lumbar paraspinal region. Neg SLR.  Compartment soft, nontender.  Skin:    General: Skin is warm and dry.     Capillary Refill: Capillary refill takes less than 2 seconds.  Neurological:     General: No focal deficit present.     Mental Status: She is alert and oriented to person, place, and time.     Cranial Nerves: No cranial nerve deficit.     Sensory: No sensory deficit.     Motor: No weakness.     Gait: Gait normal.  Psychiatric:        Mood and Affect: Mood normal.     ED Results / Procedures / Treatments   Labs (all labs ordered are listed, but only abnormal results are displayed) Labs Reviewed - No data to display   EKG None  Radiology DG Lumbar Spine Complete  Result Date: 02/08/2023 CLINICAL DATA:  Pain.  Patient reports right-sided sciatica. EXAM: LUMBAR SPINE - COMPLETE 4+ VIEW COMPARISON:  None Available. FINDINGS: Five non-rib-bearing lumbar vertebra. The alignment is maintained. Vertebral body heights are normal. There is no listhesis. The posterior elements are intact. Disc spaces are preserved. No fracture, pars defects or focal bone abnormality. Sacroiliac joints are symmetric and normal. IMPRESSION: Normal lumbar spine radiographs. Electronically Signed   By: Narda Rutherford M.D.   On: 02/08/2023 19:17   DG Knee Complete 4 Views Left  Result Date: 02/08/2023 CLINICAL DATA:  Left knee pain.  Swelling. EXAM: LEFT KNEE - COMPLETE 4+ VIEW COMPARISON:  None Available. FINDINGS: No evidence of fracture, dislocation, or joint effusion. The alignment  and joint spaces are normal. No evidence of arthropathy or other focal bone abnormality. Soft tissues are unremarkable. IMPRESSION: Negative radiographs of the left knee. Electronically Signed   By: Narda Rutherford M.D.   On: 02/08/2023 19:16    Procedures .Ortho Injury Treatment  Date/Time: 02/08/2023 7:25 PM  Performed by: Linwood Dibbles, PA-C Authorized by: Linwood Dibbles, PA-C   Consent:    Consent obtained:  Verbal   Consent given by:  Patient   Risks discussed:  Fracture, nerve  damage, stiffness, recurrent dislocation, irreducible dislocation, restricted joint movement and vascular damage   Alternatives discussed:  Referral, immobilization, alternative treatment, no treatment and delayed treatmentInjury location: knee Location details: left knee Injury type: soft tissue Pre-procedure neurovascular assessment: neurovascularly intact Pre-procedure distal perfusion: normal Pre-procedure neurological function: normal Pre-procedure range of motion: normal  Anesthesia: Local anesthesia used: no  Patient sedated: NoImmobilization: brace Splint Applied by: ED Tech Post-procedure neurovascular assessment: post-procedure neurovascularly intact Post-procedure distal perfusion: normal Post-procedure neurological function: normal Post-procedure range of motion: normal       Medications Ordered in ED Medications - No data to display  ED Course/ Medical Decision Making/ A&P    22 year old here for evaluation of left knee pain after hitting it on side of a bed 1 month ago.  Send intermittent swelling and pain.  Feels like her knee "gives way."  She is neurovascular intact, compartments are soft.  She has negative straight leg raise.  Some mild pain with varus stress.  No clinical evidence of VTE on exam.  Patient is also had intermittent right paraspinal tenderness which radiates throughout her leg for a few months.  Worse with movement.  No redness, swelling, known  traumatic injury.  No obvious effusion on exam, low suspicion for ischemia, septic joint, traumatic injury, cauda equina, discitis, osteomized, transverse mass, psoas abscess, VTE, compartment syndrome, myositis, bacterial infection  Imaging personally viewed and interpreted:  X-ray left knee without acute fracture, dislocation, effusion X-ray lumbar without significant normality  Patient ambulatory here.  Provided knee sleeve for left knee pain as well as support.  Will provide medications for her sciatica as well as her left knee pain will have her follow-up with orthopedics.  Do not feel she needs additional labs or imaging at this time.  The patient has been appropriately medically screened and/or stabilized in the ED. I have low suspicion for any other emergent medical condition which would require further screening, evaluation or treatment in the ED or require inpatient management.  Patient is hemodynamically stable and in no acute distress.  Patient able to ambulate in department prior to ED.  Evaluation does not show acute pathology that would require ongoing or additional emergent interventions while in the emergency department or further inpatient treatment.  I have discussed the diagnosis with the patient and answered all questions.  Pain is been managed while in the emergency department and patient has no further complaints prior to discharge.  Patient is comfortable with plan discussed in room and is stable for discharge at this time.  I have discussed strict return precautions for returning to the emergency department.  Patient was encouraged to follow-up with PCP/specialist refer to at discharge.                                    Medical Decision Making Amount and/or Complexity of Data Reviewed External Data Reviewed: labs, radiology and notes. Labs: ordered. Decision-making details documented in ED Course. Radiology: ordered and independent interpretation performed.  Decision-making details documented in ED Course.  Risk OTC drugs. Prescription drug management. Decision regarding hospitalization. Diagnosis or treatment significantly limited by social determinants of health.         Final Clinical Impression(s) / ED Diagnoses Final diagnoses:  Chronic right-sided low back pain with right-sided sciatica  Acute pain of left knee    Rx / DC Orders ED Discharge Orders  Ordered    predniSONE (DELTASONE) 10 MG tablet  Daily        02/08/23 1927    methocarbamol (ROBAXIN) 500 MG tablet  2 times daily        02/08/23 1927    lidocaine (LIDODERM) 5 %  Every 24 hours        02/08/23 1927              Faelynn Wynder A, PA-C 02/08/23 1925    Cyan Moultrie A, PA-C 02/08/23 1927    Sloan Leiter, DO 02/10/23 [phone]

## 2023-05-07 ENCOUNTER — Emergency Department (HOSPITAL_COMMUNITY)
Admission: EM | Admit: 2023-05-07 | Discharge: 2023-05-08 | Disposition: A | Discharge status: Home / Self Care | Payer: Medicaid Other | Attending: Emergency Medicine | Admitting: Emergency Medicine

## 2023-05-07 ENCOUNTER — Other Ambulatory Visit: Payer: Self-pay

## 2023-05-07 ENCOUNTER — Encounter (HOSPITAL_COMMUNITY): Payer: Self-pay

## 2023-05-07 ENCOUNTER — Emergency Department (HOSPITAL_COMMUNITY): Payer: Medicaid Other

## 2023-05-07 DIAGNOSIS — R079 Chest pain, unspecified: Secondary | ICD-10-CM | POA: Diagnosis present

## 2023-05-07 LAB — HCG, SERUM, QUALITATIVE: Preg, Serum: NEGATIVE

## 2023-05-07 LAB — BASIC METABOLIC PANEL
Anion gap: 10 (ref 5–15)
BUN: 8 mg/dL (ref 6–20)
CO2: 22 mmol/L (ref 22–32)
Calcium: 9.3 mg/dL (ref 8.9–10.3)
Chloride: 102 mmol/L (ref 98–111)
Creatinine, Ser: 0.9 mg/dL (ref 0.44–1.00)
GFR, Estimated: >60 mL/min (ref >60)
Glucose, Bld: 104 mg/dL — ABNORMAL HIGH (ref 70–99)
Potassium: 3.4 mmol/L — ABNORMAL LOW (ref 3.5–5.1)
Sodium: 134 mmol/L — ABNORMAL LOW (ref 135–145)

## 2023-05-07 LAB — CBC
HCT: 38.6 % (ref 36.0–46.0)
Hemoglobin: 12.9 g/dL (ref 12.0–15.0)
MCH: 25 pg — ABNORMAL LOW (ref 26.0–34.0)
MCHC: 33.4 g/dL (ref 30.0–36.0)
MCV: 75 fL — ABNORMAL LOW (ref 80.0–100.0)
Platelets: 216 10*3/uL (ref 150–400)
RBC: 5.15 MIL/uL — ABNORMAL HIGH (ref 3.87–5.11)
RDW: 15.1 % (ref 11.5–15.5)
WBC: 7.6 10*3/uL (ref 4.0–10.5)
nRBC: 0 % (ref 0.0–0.2)

## 2023-05-07 LAB — TROPONIN I (HIGH SENSITIVITY): Troponin I (High Sensitivity): <2 ng/L (ref <18)

## 2023-05-07 MED ORDER — ACETAMINOPHEN 325 MG PO TABS
650.0000 mg | ORAL_TABLET | Freq: Once | ORAL | Status: AC
  Administered 2023-05-07: 650 mg via ORAL
  Filled 2023-05-07: qty 2

## 2023-05-07 NOTE — ED Triage Notes (Signed)
Pt reports with chest pain and shob since yesterday morning. Pt states that it hurts to take a deep breath. Pt reports going to another provider and her d dimer was elevated.

## 2023-05-07 NOTE — Discharge Instructions (Signed)
Today your labs and imaging were reassuring you most likely have a viral illness.  Please follow-up with your primary care provider in regards to recent ER visit.  Please take Tylenol or ibuprofen every 6 hours needed for pain remain hydrated.  If symptoms change or worsen please return to the ER.

## 2023-05-07 NOTE — ED Provider Notes (Signed)
Erin Mccarthy AT [facility] Provider Note   CSN: [id number] Arrival date & time: 05/07/23  1909     History  Chief Complaint  Patient presents with   Chest Pain    Erin Mccarthy is a 23 y.o. female no pertinent past medical history presented with shortness of breath and chest pain that began yesterday.  Patient states that she feels that she cannot take a deep breath.  Patient states that she recently did travel on a cruise back in December but otherwise has had no hospitalizations or history of blood clots.  Patient does vape.  Patient denies any hemoptysis.  Patient went to urgent care and had a D-dimer of 0.77 and was referred to ED for further workup.  Patient does endorse generalized aches and possible sick contacts.  Patient did take Tylenol yesterday which seemed to help.    Home Medications Prior to Admission medications   Medication Sig Start Date End Date Taking? Authorizing Provider  acetaminophen (TYLENOL) 500 MG tablet Take 1 tablet (500 mg total) by mouth every 6 (six) hours as needed. 07/15/20   Arby Barrette, MD  albuterol (VENTOLIN HFA) 108 (90 Base) MCG/ACT inhaler Inhale 2 puffs into the lungs every 4 (four) hours as needed for wheezing or shortness of breath. 07/15/20   Arby Barrette, MD  cetirizine (ZYRTEC ALLERGY) 10 MG tablet Take 1 tablet (10 mg total) by mouth daily. 06/29/22   Wallis Bamberg, PA-C  ibuprofen (ADVIL) 600 MG tablet Take 1 tablet (600 mg total) by mouth every 6 (six) hours as needed. 07/15/20   Arby Barrette, MD  lidocaine (LIDODERM) 5 % Place 1 patch onto the skin daily. Remove & Discard patch within 12 hours or as directed by MD 02/08/23   Henderly, Britni A, PA-C  methocarbamol (ROBAXIN) 500 MG tablet Take 1 tablet (500 mg total) by mouth 2 (two) times daily. 02/08/23   Henderly, Britni A, PA-C  metroNIDAZOLE (FLAGYL) 500 MG tablet Take 1 tablet (500 mg total) by mouth 2 (two) times daily. 05/16/18   Elvina Sidle, MD   promethazine-dextromethorphan (PROMETHAZINE-DM) 6.25-15 MG/5ML syrup Take 5 mLs by mouth 3 (three) times daily as needed for cough. 06/29/22   Wallis Bamberg, PA-C      Allergies    Patient has no known allergies.    Review of Systems   Review of Systems  Cardiovascular:  Positive for chest pain.    Physical Exam Updated Vital Signs BP (!) 141/91 (BP Location: Left Arm) Comment: Simultaneous filing. User may not have seen previous data.  Pulse 96 Comment: Simultaneous filing. User may not have seen previous data.  Temp 98.4 F (36.9 C) (Oral) Comment: Simultaneous filing. User may not have seen previous data.  Resp 14 Comment: Simultaneous filing. User may not have seen previous data.  Ht 5\' 1"  (1.549 m)   Wt 64.3 kg   SpO2 100%   BMI 26.78 kg/m  Physical Exam Vitals reviewed.  Constitutional:      General: She is not in acute distress. HENT:     Head: Normocephalic and atraumatic.  Eyes:     Extraocular Movements: Extraocular movements intact.     Conjunctiva/sclera: Conjunctivae normal.     Pupils: Pupils are equal, round, and reactive to light.  Cardiovascular:     Rate and Rhythm: Normal rate and regular rhythm.     Pulses: Normal pulses.     Heart sounds: Normal heart sounds.     Comments: 2+ bilateral radial/dorsalis  pedis pulses with regular rate Pulmonary:     Effort: Pulmonary effort is normal. No respiratory distress.     Breath sounds: Normal breath sounds.  Abdominal:     Palpations: Abdomen is soft.     Tenderness: There is no abdominal tenderness. There is no guarding or rebound.  Musculoskeletal:        General: Normal range of motion.     Cervical back: Normal range of motion and neck supple.     Comments: 5 out of 5 bilateral grip/leg extension strength  Skin:    General: Skin is warm and dry.     Capillary Refill: Capillary refill takes less than 2 seconds.  Neurological:     General: No focal deficit present.     Mental Status: She is alert and  oriented to person, place, and time.     Comments: Sensation intact in all 4 limbs  Psychiatric:        Mood and Affect: Mood normal.     ED Results / Procedures / Treatments   Labs (all labs ordered are listed, but only abnormal results are displayed) Labs Reviewed  BASIC METABOLIC PANEL - Abnormal; Notable for the following components:      Result Value   Sodium 134 (*)    Potassium 3.4 (*)    Glucose, Bld 104 (*)    All other components within normal limits  CBC - Abnormal; Notable for the following components:   RBC 5.15 (*)    MCV 75.0 (*)    MCH 25.0 (*)    All other components within normal limits  HCG, SERUM, QUALITATIVE  TROPONIN I (HIGH SENSITIVITY)    EKG None  Radiology DG Chest 2 View Result Date: 05/07/2023 CLINICAL DATA:  Chest pain short of breath EXAM: CHEST - 2 VIEW COMPARISON:  07/15/2020 FINDINGS: The heart size and mediastinal contours are within normal limits. Both lungs are clear. The visualized skeletal structures are unremarkable. IMPRESSION: No active cardiopulmonary disease. Electronically Signed   By: Jasmine Pang M.D.   On: 05/07/2023 20:48    Procedures Procedures    Medications Ordered in ED Medications  acetaminophen (TYLENOL) tablet 650 mg (has no administration in time range)    ED Course/ Medical Decision Making/ A&P                                 Medical Decision Making Amount and/or Complexity of Data Reviewed Labs: ordered. Radiology: ordered.  Risk OTC drugs.   Erin Mccarthy 23 y.o. presented today for shortness of breath.  Working DDx that I considered at this time includes, but not limited to, asthma/COPD exacerbation, URI, viral illness, anemia, ACS, PE, pneumonia, pleural effusion, lung cancer, CHF, respiratory distress, medication side effect, intoxication.  R/o DDx: pending  Review of prior external notes: 03/31/2023 office visit  Unique Tests and My Independent Interpretation:  CBC: Unremarkable BMP:  Unremarkable EKG: Sinus 95 bpm, no ST elevations or depressions noted, no signs of right heart strain Troponin: Less than 2 CXR: No acute findings CTA Chest PE: pending Respiratory Panel: pending  Social Determinants of Health: none  Discussion with Independent Historian:  Friend  Discussion of Management of Tests: None  Risk: Medium: prescription drug management  Risk Stratification Score: None  Plan: On exam patient was no acute distress stable vitals. Physical exam showed unremarkable. The cardiac monitor was ordered secondary to the patient's history of shortness of  breath and to monitor the patient for dysrhythmia. Cardiac monitor by my independent interpretation showed normal sinus.  Patient was able to show me paperwork from urgent care showing that she had an elevated D-dimer and so we will get a CTA to rule out PE.  Patient symptoms however are consistent with viral illness that she has generalized aching and may have sick contact with symptoms getting better with medications yesterday.  Will give patient Tylenol in the meantime.  Patient's first troponin was negative and so with symptoms being present since yesterday do not need second.  Patient signed out to [facility], [place].  Please review their note for the continuation of patient's care.  The plan at this point is follow-up on imaging and if negative can discharge.  Discussed smoking cessation with patient and was they were offerred resources to help stop.  Total time was 5 min CPT code 82956.   This chart was dictated using voice recognition software.  Despite best efforts to proofread,  errors can occur which can change the documentation meaning.         Final Clinical Impression(s) / ED Diagnoses Final diagnoses:  None    Rx / DC Orders ED Discharge Orders     None         Remi Deter 05/07/23 2356    Benjiman Core, MD 05/10/23 [phone]

## 2023-05-07 NOTE — ED Notes (Signed)
Pt refusing flu/covid swab, states she had one earlier today that was negative.

## 2023-05-07 NOTE — ED Notes (Signed)
Pt has a blue top in the lab

## 2023-05-08 ENCOUNTER — Emergency Department (HOSPITAL_COMMUNITY): Payer: Medicaid Other

## 2023-05-08 MED ORDER — IOHEXOL 350 MG/ML SOLN
100.0000 mL | Freq: Once | INTRAVENOUS | Status: AC | PRN
  Administered 2023-05-08: 100 mL via INTRAVENOUS

## 2023-05-08 NOTE — ED Notes (Signed)
 Patient transported to CT

## 2023-05-08 NOTE — ED Provider Notes (Signed)
  Physical Exam  BP 122/89   Pulse 93   Temp 98 F (36.7 C) (Oral)   Resp 17   Ht 5\' 1"  (1.549 m)   Wt 64.3 kg   SpO2 100%   BMI 26.78 kg/m   Physical Exam  Procedures  Procedures  ED Course / MDM    Medical Decision Making Amount and/or Complexity of Data Reviewed Labs: ordered. Radiology: ordered.  Risk OTC drugs. Prescription drug management.    Patient care assumed from Evlyn Kanner, PA-C at shift handoff.  See his note for full details.  In short, 23 year old female patient with no relevant past medical history presented concerning shortness of breath and chest pain which began yesterday.  She feels like she is having difficulty taking a deep breath.  She was seen in urgent care earlier in the day for the same and had a positive D-dimer of 0.77 and was referred to the emergency department for further workup.  She Dors is generalized bodyaches, possible sick contacts, states that Tylenol did seem to help symptoms.  At time of assumption of care disposition was pending results of CT angio PE study.  CT angio PE study was negative for acute process including pulmonary embolism.  At this time patient symptoms seem most consistent with a viral illness.  She had negative respiratory panel earlier today at the urgent care.  Will discharge home with recommendations for supportive care at home.  Return precautions provided.      Pamala Duffel 05/08/23 0243    Zadie Rhine, MD 05/08/23 [phone]

## 2023-05-25 ENCOUNTER — Other Ambulatory Visit: Payer: Self-pay

## 2023-05-25 ENCOUNTER — Emergency Department (HOSPITAL_BASED_OUTPATIENT_CLINIC_OR_DEPARTMENT_OTHER)

## 2023-05-25 ENCOUNTER — Encounter (HOSPITAL_BASED_OUTPATIENT_CLINIC_OR_DEPARTMENT_OTHER): Payer: Self-pay | Admitting: Emergency Medicine

## 2023-05-25 ENCOUNTER — Emergency Department (HOSPITAL_BASED_OUTPATIENT_CLINIC_OR_DEPARTMENT_OTHER)
Admission: EM | Admit: 2023-05-25 | Discharge: 2023-05-25 | Disposition: A | Discharge status: Home / Self Care | Attending: Emergency Medicine | Admitting: Emergency Medicine

## 2023-05-25 DIAGNOSIS — R067 Sneezing: Secondary | ICD-10-CM | POA: Diagnosis not present

## 2023-05-25 DIAGNOSIS — M2550 Pain in unspecified joint: Secondary | ICD-10-CM | POA: Diagnosis not present

## 2023-05-25 DIAGNOSIS — R059 Cough, unspecified: Secondary | ICD-10-CM | POA: Insufficient documentation

## 2023-05-25 DIAGNOSIS — R0789 Other chest pain: Secondary | ICD-10-CM | POA: Diagnosis present

## 2023-05-25 DIAGNOSIS — R0602 Shortness of breath: Secondary | ICD-10-CM | POA: Insufficient documentation

## 2023-05-25 DIAGNOSIS — R519 Headache, unspecified: Secondary | ICD-10-CM | POA: Insufficient documentation

## 2023-05-25 LAB — RESP PANEL BY RT-PCR (RSV, FLU A&B, COVID)  RVPGX2
Influenza A by PCR: NEGATIVE
Influenza B by PCR: NEGATIVE
Resp Syncytial Virus by PCR: NEGATIVE
SARS Coronavirus 2 by RT PCR: NEGATIVE

## 2023-05-25 MED ORDER — ACETAMINOPHEN 500 MG PO TABS
1000.0000 mg | ORAL_TABLET | Freq: Once | ORAL | Status: AC
  Administered 2023-05-25: 1000 mg via ORAL
  Filled 2023-05-25: qty 2

## 2023-05-25 MED ORDER — FAMOTIDINE 20 MG PO TABS
20.0000 mg | ORAL_TABLET | Freq: Once | ORAL | Status: AC
  Administered 2023-05-25: 20 mg via ORAL
  Filled 2023-05-25: qty 1

## 2023-05-25 MED ORDER — CETIRIZINE HCL 10 MG PO TABS: 10.0000 mg | ORAL_TABLET | Freq: Every day | ORAL | 0 refills | Status: AC

## 2023-05-25 MED ORDER — ALBUTEROL SULFATE HFA 108 (90 BASE) MCG/ACT IN AERS
2.0000 | INHALATION_SPRAY | RESPIRATORY_TRACT | Status: DC | PRN
  Administered 2023-05-25: 2 via RESPIRATORY_TRACT
  Filled 2023-05-25: qty 6.7

## 2023-05-25 NOTE — Discharge Instructions (Signed)
 Evaluation today was overall reassuring.  Recommend you follow-up with your PCP.  In the meantime would recommend Zyrtec or Claritin.  If your chest pain changes or worsens in any way, you develop shortness of breath, calf tenderness or any other concerning symptom please return emergency department further evaluation.

## 2023-05-25 NOTE — ED Provider Notes (Cosign Needed Addendum)
 [place] [facility] AT [facility] Provider Note   CSN: [id number] Arrival date & time: 05/25/23  1150     History  Chief Complaint  Patient presents with   Shortness of Breath   HPI Erin Mccarthy is a 23 y.o. female presenting for chest pain and shortness of breath.  States has been going on for about 3 weeks.  She attributes to potential mold exposure in her apartment.  Also endorses cough sneezing, joint pain and headache.  States the chest pain is in the center of her chest but is nonradiating otherwise.  It is worse with coughing and deep breaths.  Symptoms are nonexertional.  She states that she slept at a family member's home last night and her symptoms did improve considerably.  She states that she was also worked up for this chest pain here about 2 weeks ago.  Due to an elevated D-dimer at urgent care, there was some concern for possible PE.  CT angio of the chest was done at that time which was negative for PE.  She states that the pain is remained unchanged since that encounter.  She denies OCP use, calf tenderness, recent long trips or surgeries and no known history of coagulopathy.   Shortness of Breath      Home Medications Prior to Admission medications   Medication Sig Start Date End Date Taking? Authorizing Provider  cetirizine (ZYRTEC ALLERGY) 10 MG tablet Take 1 tablet (10 mg total) by mouth daily. 05/25/23  Yes Gareth Eagle, PA-C  acetaminophen (TYLENOL) 500 MG tablet Take 1 tablet (500 mg total) by mouth every 6 (six) hours as needed. Patient not taking: Reported on 05/07/2023 07/15/20   Arby Barrette, MD  ibuprofen (ADVIL) 600 MG tablet Take 1 tablet (600 mg total) by mouth every 6 (six) hours as needed. Patient not taking: Reported on 05/07/2023 07/15/20   Arby Barrette, MD  lidocaine (LIDODERM) 5 % Place 1 patch onto the skin daily. Remove & Discard patch within 12 hours or as directed by MD Patient not taking: Reported on 05/07/2023  02/08/23   Henderly, Britni A, PA-C  methocarbamol (ROBAXIN) 500 MG tablet Take 1 tablet (500 mg total) by mouth 2 (two) times daily. Patient not taking: Reported on 05/07/2023 02/08/23   Henderly, Britni A, PA-C  metroNIDAZOLE (FLAGYL) 500 MG tablet Take 1 tablet (500 mg total) by mouth 2 (two) times daily. Patient not taking: Reported on 05/07/2023 05/16/18   Elvina Sidle, MD  promethazine-dextromethorphan (PROMETHAZINE-DM) 6.25-15 MG/5ML syrup Take 5 mLs by mouth 3 (three) times daily as needed for cough. Patient not taking: Reported on 05/07/2023 06/29/22   Wallis Bamberg, PA-C      Allergies    Patient has no known allergies.    Review of Systems   Review of Systems  Respiratory:  Positive for shortness of breath.     Physical Exam   Vitals:   05/25/23 1430 05/25/23 1515  BP: 115/72 (!) 107/95  Pulse: 84 79  Resp: (!) 21 19  Temp: 98.5 F (36.9 C)   SpO2: 98% 100%    CONSTITUTIONAL:  well-appearing, NAD NEURO:  Alert and oriented x 3, CN 3-12 grossly intact EYES:  eyes equal and reactive ENT/NECK:  Supple, no stridor  CARDIO:  regular rate and rhythm, appears well-perfused  PULM:  No respiratory distress, CTAB GI/GU:  non-distended, soft MSK/SPINE:  No gross deformities, no edema, moves all extremities  SKIN:  no rash, atraumatic   *Additional and/or pertinent  findings included in MDM below    ED Results / Procedures / Treatments   Labs (all labs ordered are listed, but only abnormal results are displayed) Labs Reviewed  RESP PANEL BY RT-PCR (RSV, FLU A&B, COVID)  [id number]    EKG EKG Interpretation Date/Time:  Tuesday May 25 2023 12:52:39 EST Ventricular Rate:  90 PR Interval:  186 QRS Duration:  74 QT Interval:  346 QTC Calculation: 423 R Axis:   70  Text Interpretation: Normal sinus rhythm Nonspecific ST and T wave abnormality  overall similar to Feb 2025 Confirmed by Pricilla Loveless [phone]) on 05/25/2023 2:15:24 PM  Radiology DG Chest 2 View Result  Date: 05/25/2023 CLINICAL DATA:  Cough and shortness of breath for 3 weeks, mold exposure EXAM: CHEST - 2 VIEW COMPARISON:  05/07/2023 FINDINGS: Normal heart size and vascularity. Minor bandlike left basilar atelectasis. No definite focal pneumonia, collapse or consolidation. Negative for edema, effusion or pneumothorax. Trachea midline. No osseous abnormality. IMPRESSION: Minor left basilar atelectasis. No other acute finding by plain radiography. Electronically Signed   By: Judie Petit.  Shick M.D.   On: 05/25/2023 15:01    Procedures Procedures    Medications Ordered in ED Medications  albuterol (VENTOLIN HFA) 108 (90 Base) MCG/ACT inhaler 2 puff (2 puffs Inhalation Given 05/25/23 1516)  acetaminophen (TYLENOL) tablet 1,000 mg (1,000 mg Oral Given 05/25/23 1510)  famotidine (PEPCID) tablet 20 mg (20 mg Oral Given 05/25/23 1510)    ED Course/ Medical Decision Making/ A&P                                 Medical Decision Making Amount and/or Complexity of Data Reviewed Radiology: ordered.  Risk OTC drugs. Prescription drug management.   23 year old well-appearing female presenting for chest pain and shortness of breath.  Exam was unremarkable.  DDx includes ACS, PE, pneumonia, pneumothorax, reflux, viral illness, mold exposure, other.  Respiratory PCR was negative.  I personally reviewed and interpreted her x-ray which revealed no acute findings.  I personally reviewed and interpreted her EKG which revealed normal sinus rhythm.  Considered PE but unlikely given she had a negative CTA of her chest 2 weeks ago and patient is PERC negative.  On reassessment after Tylenol and Pepcid her symptoms had resolved.  Suspect it is possible that this could be related to mold exposure specially since her roommate is having the same kind of symptoms.  Advised her to follow-up with her PCP.  Recommended Zyrtec.  Discussed return precautions.  Discharged good condition.  Also considered ACS but unlikely given atypical  chest pain and reassuring EKG and lack of risk factors.        Final Clinical Impression(s) / ED Diagnoses Final diagnoses:  Atypical chest pain    Rx / DC Orders ED Discharge Orders          Ordered    cetirizine (ZYRTEC ALLERGY) 10 MG tablet  Daily        05/25/23 1544              Gareth Eagle, PA-C 05/25/23 1544    Pricilla Loveless, MD 05/27/23 1026

## 2023-05-25 NOTE — ED Triage Notes (Signed)
 Pt POV steady gait- concerned for mold exposure x3 weeks. C/o cough, sneezing, Shob, joint pain, headache, CP progressively worsening in last month.
